# Patient Record
Sex: Female | Born: 1955 | Race: Black or African American | Hispanic: No | Marital: Married | Smoking: Never smoker
Health system: Southern US, Community
[De-identification: ages and names within clinical notes are randomized; demographics above are authoritative.]

## PROBLEM LIST (undated history)

## (undated) DIAGNOSIS — N952 Postmenopausal atrophic vaginitis: Secondary | ICD-10-CM

## (undated) DIAGNOSIS — S8992XA Unspecified injury of left lower leg, initial encounter: Secondary | ICD-10-CM

## (undated) DIAGNOSIS — E785 Hyperlipidemia, unspecified: Secondary | ICD-10-CM

---

## 2015-08-14 ENCOUNTER — Other Ambulatory Visit: Payer: Self-pay | Admitting: Family Medicine

## 2015-08-14 DIAGNOSIS — Z1231 Encounter for screening mammogram for malignant neoplasm of breast: Secondary | ICD-10-CM

## 2015-08-15 ENCOUNTER — Other Ambulatory Visit: Payer: Self-pay | Admitting: Family Medicine

## 2015-08-15 DIAGNOSIS — R319 Hematuria, unspecified: Secondary | ICD-10-CM

## 2015-08-17 ENCOUNTER — Other Ambulatory Visit: Payer: Self-pay

## 2015-08-17 ENCOUNTER — Ambulatory Visit
Admission: RE | Admit: 2015-08-17 | Discharge: 2015-08-17 | Disposition: A | Discharge status: Home / Self Care | Payer: Federal, State, Local not specified - PPO | Source: Ambulatory Visit | Attending: Family Medicine | Admitting: Family Medicine

## 2015-08-17 DIAGNOSIS — D259 Leiomyoma of uterus, unspecified: Secondary | ICD-10-CM | POA: Insufficient documentation

## 2015-08-17 DIAGNOSIS — R319 Hematuria, unspecified: Secondary | ICD-10-CM | POA: Insufficient documentation

## 2015-08-17 MED ORDER — IOHEXOL 350 MG/ML SOLN
125.0000 mL | Freq: Once | INTRAVENOUS | Status: AC | PRN
  Administered 2015-08-17: 125 mL via INTRAVENOUS

## 2015-10-09 ENCOUNTER — Ambulatory Visit
Admission: RE | Admit: 2015-10-09 | Discharge: 2015-10-09 | Disposition: A | Discharge status: Home / Self Care | Payer: Federal, State, Local not specified - PPO | Source: Ambulatory Visit | Attending: Family Medicine | Admitting: Family Medicine

## 2015-10-09 DIAGNOSIS — Z1231 Encounter for screening mammogram for malignant neoplasm of breast: Secondary | ICD-10-CM | POA: Insufficient documentation

## 2016-09-15 ENCOUNTER — Other Ambulatory Visit: Payer: Self-pay | Admitting: Family Medicine

## 2016-09-15 DIAGNOSIS — Z1231 Encounter for screening mammogram for malignant neoplasm of breast: Secondary | ICD-10-CM

## 2016-10-09 ENCOUNTER — Ambulatory Visit
Admission: RE | Admit: 2016-10-09 | Discharge: 2016-10-09 | Disposition: A | Discharge status: Home / Self Care | Payer: Federal, State, Local not specified - PPO | Source: Ambulatory Visit | Attending: Family Medicine | Admitting: Family Medicine

## 2016-10-09 DIAGNOSIS — Z1231 Encounter for screening mammogram for malignant neoplasm of breast: Secondary | ICD-10-CM

## 2017-09-28 ENCOUNTER — Other Ambulatory Visit: Payer: Self-pay | Admitting: Family Medicine

## 2017-09-28 DIAGNOSIS — Z1231 Encounter for screening mammogram for malignant neoplasm of breast: Secondary | ICD-10-CM

## 2017-10-14 ENCOUNTER — Ambulatory Visit
Admission: RE | Admit: 2017-10-14 | Discharge: 2017-10-14 | Disposition: A | Discharge status: Home / Self Care | Payer: Federal, State, Local not specified - PPO | Source: Ambulatory Visit | Attending: Family Medicine | Admitting: Family Medicine

## 2017-10-14 DIAGNOSIS — Z1231 Encounter for screening mammogram for malignant neoplasm of breast: Secondary | ICD-10-CM

## 2018-09-16 ENCOUNTER — Encounter: Payer: Self-pay | Admitting: Student

## 2018-09-17 ENCOUNTER — Ambulatory Visit
Admission: RE | Admit: 2018-09-17 | Discharge: 2018-09-17 | Disposition: A | Discharge status: Home / Self Care | Payer: Federal, State, Local not specified - PPO | Source: Ambulatory Visit | Attending: Gastroenterology | Admitting: Gastroenterology

## 2018-09-17 ENCOUNTER — Encounter
Admission: RE | Disposition: A | Discharge status: Home / Self Care | Payer: Self-pay | Source: Ambulatory Visit | Attending: Gastroenterology

## 2018-09-17 ENCOUNTER — Ambulatory Visit: Payer: Federal, State, Local not specified - PPO | Admitting: Anesthesiology

## 2018-09-17 ENCOUNTER — Encounter: Payer: Self-pay | Admitting: Emergency Medicine

## 2018-09-17 DIAGNOSIS — Z79899 Other long term (current) drug therapy: Secondary | ICD-10-CM | POA: Insufficient documentation

## 2018-09-17 DIAGNOSIS — Z86010 Personal history of colonic polyps: Secondary | ICD-10-CM | POA: Diagnosis not present

## 2018-09-17 DIAGNOSIS — K635 Polyp of colon: Secondary | ICD-10-CM | POA: Diagnosis not present

## 2018-09-17 DIAGNOSIS — E785 Hyperlipidemia, unspecified: Secondary | ICD-10-CM | POA: Diagnosis not present

## 2018-09-17 DIAGNOSIS — Z1211 Encounter for screening for malignant neoplasm of colon: Secondary | ICD-10-CM | POA: Diagnosis not present

## 2018-09-17 DIAGNOSIS — K5730 Diverticulosis of large intestine without perforation or abscess without bleeding: Secondary | ICD-10-CM | POA: Insufficient documentation

## 2018-09-17 HISTORY — DX: Postmenopausal atrophic vaginitis: N95.2

## 2018-09-17 HISTORY — PX: COLONOSCOPY WITH PROPOFOL: SHX5780

## 2018-09-17 HISTORY — DX: Hyperlipidemia, unspecified: E78.5

## 2018-09-17 HISTORY — DX: Unspecified injury of left lower leg, initial encounter: S89.92XA

## 2018-09-17 SURGERY — COLONOSCOPY WITH PROPOFOL
Anesthesia: General

## 2018-09-17 MED ORDER — SODIUM CHLORIDE 0.9 % IV SOLN
INTRAVENOUS | Status: DC
  Administered 2018-09-17: 1000 mL via INTRAVENOUS

## 2018-09-17 MED ORDER — PROPOFOL 10 MG/ML IV BOLUS
INTRAVENOUS | Status: DC | PRN
  Administered 2018-09-17: 80 mg via INTRAVENOUS

## 2018-09-17 MED ORDER — PROPOFOL 500 MG/50ML IV EMUL
INTRAVENOUS | Status: DC | PRN
  Administered 2018-09-17: 75 ug/kg/min via INTRAVENOUS

## 2018-09-17 MED ORDER — LIDOCAINE HCL (CARDIAC) PF 100 MG/5ML IV SOSY
PREFILLED_SYRINGE | INTRAVENOUS | Status: DC | PRN
  Administered 2018-09-17: 25 mg via INTRATRACHEAL

## 2018-09-17 MED ORDER — PROPOFOL 500 MG/50ML IV EMUL
INTRAVENOUS | Status: AC
  Filled 2018-09-17: qty 50

## 2018-09-17 NOTE — Anesthesia Preprocedure Evaluation (Addendum)
Anesthesia Evaluation  Patient identified by MRN, date of birth, ID band Patient awake    Reviewed: Allergy & Precautions, H&P , NPO status , Patient's Chart, lab work & pertinent test results  Airway Mallampati: II  TM Distance: >3 FB     Dental  (+) Teeth Intact   Pulmonary neg pulmonary ROS,           Cardiovascular negative cardio ROS       Neuro/Psych negative neurological ROS  negative psych ROS   GI/Hepatic negative GI ROS, Neg liver ROS,   Endo/Other  negative endocrine ROS  Renal/GU negative Renal ROS  negative genitourinary   Musculoskeletal   Abdominal   Peds  Hematology negative hematology ROS (+)   Anesthesia Other Findings Past Medical History: No date: Atrophic vaginitis No date: Hyperlipidemia No date: Left knee injury  History reviewed. No pertinent surgical history.  BMI    Body Mass Index:  28.15 kg/m      Reproductive/Obstetrics negative OB ROS                            Anesthesia Physical Anesthesia Plan  ASA: I  Anesthesia Plan: General   Post-op Pain Management:    Induction:   PONV Risk Score and Plan: Propofol infusion and TIVA  Airway Management Planned: Natural Airway and Nasal Cannula  Additional Equipment:   Intra-op Plan:   Post-operative Plan:   Informed Consent: I have reviewed the patients History and Physical, chart, labs and discussed the procedure including the risks, benefits and alternatives for the proposed anesthesia with the patient or authorized representative who has indicated his/her understanding and acceptance.   Dental Advisory Given  Plan Discussed with: Anesthesiologist, CRNA and Surgeon  Anesthesia Plan Comments:        Anesthesia Quick Evaluation

## 2018-09-17 NOTE — Transfer of Care (Signed)
Immediate Anesthesia Transfer of Care Note  Patient: Audrey Palmer  Procedure(s) Performed: COLONOSCOPY WITH PROPOFOL (N/A )  Patient Location: PACU and Endoscopy Unit  Anesthesia Type:General  Level of Consciousness: unresponsive  Airway & Oxygen Therapy: Patient Spontanous Breathing and Patient connected to nasal cannula oxygen  Post-op Assessment: Report given to RN and Post -op Vital signs reviewed and stable  Post vital signs: Reviewed and stable  Last Vitals:  Vitals Value Taken Time  BP 122/79 09/17/2018  9:19 AM  Temp 36.6 C 09/17/2018  9:19 AM  Pulse 81 09/17/2018  9:20 AM  Resp 20 09/17/2018  9:20 AM  SpO2 100 % 09/17/2018  9:20 AM  Vitals shown include unvalidated device data.  Last Pain:  Vitals:   09/17/18 0919  TempSrc: Tympanic  PainSc: Asleep         Complications: No apparent anesthesia complications

## 2018-09-17 NOTE — Anesthesia Post-op Follow-up Note (Signed)
Anesthesia QCDR form completed.        

## 2018-09-17 NOTE — Op Note (Signed)
[facility] Patient Name: Audrey Palmer Procedure Date: 09/17/2018 8:52 AM MRN: [id number] Account #: [id number] Date of Birth: 07-Mar-1956 Admit Type: Outpatient Age: 62 Room: [facility] ENDO ROOM 3 Gender: Female Note Status: Finalized Procedure:            Colonoscopy Indications:          Personal history of colonic polyps Providers:            Lollie Sails, MD Referring MD:         Rubbie Battiest. Iona Beard MD, MD (Referring MD) Medicines:            Monitored Anesthesia Care Complications:        No immediate complications. Procedure:            Pre-Anesthesia Assessment:                       - ASA Grade Assessment: I - A normal, healthy patient.                       After obtaining informed consent, the colonoscope was                        passed under direct vision. Throughout the procedure,                        the patient's blood pressure, pulse, and oxygen                        saturations were monitored continuously. The                        Colonoscope was introduced through the anus and                        advanced to the the cecum, identified by appendiceal                        orifice and ileocecal valve. The colonoscopy was                        performed without difficulty. The patient tolerated the                        procedure well. The quality of the bowel preparation                        was good. Findings:      A 2 mm polyp was found in the distal sigmoid colon. The polyp was       sessile. The polyp was removed with a cold biopsy forceps. Resection and       retrieval were complete.      A few small-mouthed diverticula were found in the sigmoid colon and       distal descending colon.      The retroflexed view of the distal rectum and anal verge was normal and       showed no anal or rectal abnormalities.      The digital rectal exam was normal. Impression:           - One 2 mm polyp in the distal sigmoid  colon,  removed                        with a cold biopsy forceps. Resected and retrieved.                       - Diverticulosis in the sigmoid colon and in the distal                        descending colon.                       - The distal rectum and anal verge are normal on                        retroflexion view. Recommendation:       - Discharge patient to home. Procedure Code(s):    --- Professional ---                       [id number], Colonoscopy, flexible; with biopsy, single or                        multiple Diagnosis Code(s):    --- Professional ---                       D12.5, Benign neoplasm of sigmoid colon                       Z86.010, Personal history of colonic polyps                       K57.30, Diverticulosis of large intestine without                        perforation or abscess without bleeding CPT copyright 2018 [employer]. All rights reserved. The codes documented in this report are preliminary and upon coder review may  be revised to meet current compliance requirements. Lollie Sails, MD 09/17/2018 9:17:23 AM This report has been signed electronically. Number of Addenda: 0 Note Initiated On: 09/17/2018 8:52 AM Scope Withdrawal Time: 0 hours 7 minutes 2 seconds  Total Procedure Duration: 0 hours 13 minutes 6 seconds       [facility]

## 2018-09-17 NOTE — H&P (Signed)
Outpatient short stay form Pre-procedure 09/17/2018 8:42 AM Audrey Sails MD  Primary Physician: Dr Salome Holmes  Reason for visit: Colonoscopy  History of present illness: Patient is a 62 year old female presenting today for colonoscopy due to her personal history of colon polyps.  She tolerated her prep well.  She takes no aspirin or blood thinning agent.    Current Facility-Administered Medications:  .  0.9 %  sodium chloride infusion, , Intravenous, Continuous, Audrey Sails, MD, Last Rate: 20 mL/hr at 09/17/18 0732, 1,000 mL at 09/17/18 0732  Medications Prior to Admission  Medication Sig Dispense Refill Last Dose  . atorvastatin (LIPITOR) 20 MG tablet Take 20 mg by mouth daily.   Past Week at Unknown time  . Cholecalciferol 25 MCG (1000 UT) capsule Take 2,000 Units by mouth daily.   Past Week at Unknown time     Not on File   Past Medical History:  Diagnosis Date  . Atrophic vaginitis   . Hyperlipidemia   . Left knee injury     Review of systems:      Physical Exam    Heart and lungs: Regular rate and rhythm without rub or gallop, lungs are bilaterally clear.    HEENT: Normocephalic atraumatic eyes are anicteric    Other:    Pertinant exam for procedure: Soft non-tender nondistended bowel sounds positive normoactive    Planned proceedures: Colonoscopy and indicated procedures. I have discussed the risks benefits and complications of procedures to include not limited to bleeding, infection, perforation and the risk of sedation and the patient wishes to proceed.    Audrey Sails, MD Gastroenterology 09/17/2018  8:42 AM

## 2018-09-20 LAB — SURGICAL PATHOLOGY

## 2018-09-20 NOTE — Anesthesia Postprocedure Evaluation (Signed)
Anesthesia Post Note  Patient: [employer]  Procedure(s) Performed: COLONOSCOPY WITH PROPOFOL (N/A )  Patient location during evaluation: PACU Anesthesia Type: General Level of consciousness: awake and alert Pain management: pain level controlled Vital Signs Assessment: post-procedure vital signs reviewed and stable Respiratory status: spontaneous breathing, nonlabored ventilation and respiratory function stable Cardiovascular status: blood pressure returned to baseline and stable Postop Assessment: no apparent nausea or vomiting Anesthetic complications: no     Last Vitals:  Vitals:   09/17/18 0919 09/17/18 0929  BP: 122/79 (!) 128/96  Pulse:    Resp: (!) 22   Temp: 36.6 C   SpO2:      Last Pain:  Vitals:   09/17/18 0939  TempSrc:   PainSc: 0-No pain                 Durenda Hurt

## 2018-09-21 ENCOUNTER — Other Ambulatory Visit: Payer: Self-pay | Admitting: Family Medicine

## 2018-09-21 ENCOUNTER — Encounter: Payer: Self-pay | Admitting: Gastroenterology

## 2018-09-21 DIAGNOSIS — Z1231 Encounter for screening mammogram for malignant neoplasm of breast: Secondary | ICD-10-CM

## 2018-10-18 ENCOUNTER — Encounter (INDEPENDENT_AMBULATORY_CARE_PROVIDER_SITE_OTHER): Payer: Self-pay

## 2018-10-18 ENCOUNTER — Ambulatory Visit
Admission: RE | Admit: 2018-10-18 | Discharge: 2018-10-18 | Disposition: A | Discharge status: Home / Self Care | Payer: Federal, State, Local not specified - PPO | Source: Ambulatory Visit | Attending: Family Medicine | Admitting: Family Medicine

## 2018-10-18 DIAGNOSIS — Z1231 Encounter for screening mammogram for malignant neoplasm of breast: Secondary | ICD-10-CM | POA: Insufficient documentation

## 2019-09-16 ENCOUNTER — Other Ambulatory Visit: Payer: Self-pay | Admitting: Family Medicine

## 2019-09-16 DIAGNOSIS — Z1231 Encounter for screening mammogram for malignant neoplasm of breast: Secondary | ICD-10-CM

## 2019-10-20 ENCOUNTER — Ambulatory Visit
Admission: RE | Admit: 2019-10-20 | Discharge: 2019-10-20 | Disposition: A | Discharge status: Home / Self Care | Payer: Federal, State, Local not specified - PPO | Source: Ambulatory Visit | Attending: Family Medicine | Admitting: Family Medicine

## 2019-10-20 ENCOUNTER — Other Ambulatory Visit: Payer: Self-pay

## 2019-10-20 DIAGNOSIS — Z1231 Encounter for screening mammogram for malignant neoplasm of breast: Secondary | ICD-10-CM | POA: Diagnosis not present

## 2019-10-25 ENCOUNTER — Other Ambulatory Visit: Payer: Self-pay | Admitting: Family Medicine

## 2019-10-25 DIAGNOSIS — R928 Other abnormal and inconclusive findings on diagnostic imaging of breast: Secondary | ICD-10-CM

## 2019-10-31 ENCOUNTER — Ambulatory Visit
Admission: RE | Admit: 2019-10-31 | Discharge: 2019-10-31 | Disposition: A | Discharge status: Home / Self Care | Payer: Federal, State, Local not specified - PPO | Source: Ambulatory Visit | Attending: Family Medicine | Admitting: Family Medicine

## 2019-10-31 DIAGNOSIS — R928 Other abnormal and inconclusive findings on diagnostic imaging of breast: Secondary | ICD-10-CM

## 2020-06-05 ENCOUNTER — Other Ambulatory Visit: Payer: Self-pay | Admitting: Family Medicine

## 2020-06-05 DIAGNOSIS — Z1231 Encounter for screening mammogram for malignant neoplasm of breast: Secondary | ICD-10-CM

## 2021-02-19 ENCOUNTER — Other Ambulatory Visit: Payer: Self-pay | Admitting: Family Medicine

## 2021-02-19 DIAGNOSIS — Z1231 Encounter for screening mammogram for malignant neoplasm of breast: Secondary | ICD-10-CM

## 2021-03-04 ENCOUNTER — Ambulatory Visit
Admission: RE | Admit: 2021-03-04 | Discharge: 2021-03-04 | Disposition: A | Discharge status: Home / Self Care | Payer: Federal, State, Local not specified - PPO | Source: Ambulatory Visit | Attending: Family Medicine | Admitting: Family Medicine

## 2021-03-04 ENCOUNTER — Other Ambulatory Visit: Payer: Self-pay

## 2021-03-04 DIAGNOSIS — Z1231 Encounter for screening mammogram for malignant neoplasm of breast: Secondary | ICD-10-CM | POA: Insufficient documentation

## 2021-03-21 IMAGING — MG DIGITAL SCREENING BILAT W/ TOMO W/ CAD
8 series · 8 of 24 positions shown · non-contrast
Comparison: Previous exam(s).

CLINICAL DATA: Screening.

EXAM:
DIGITAL SCREENING BILATERAL MAMMOGRAM WITH TOMO AND CAD

[L MLO synth-2D]
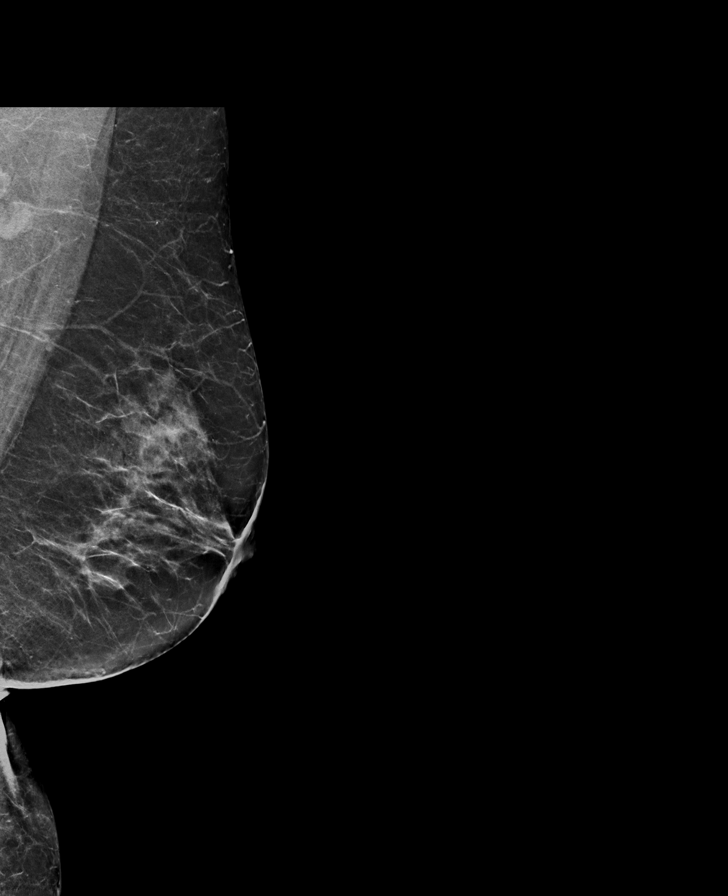

[R MLO synth-2D]
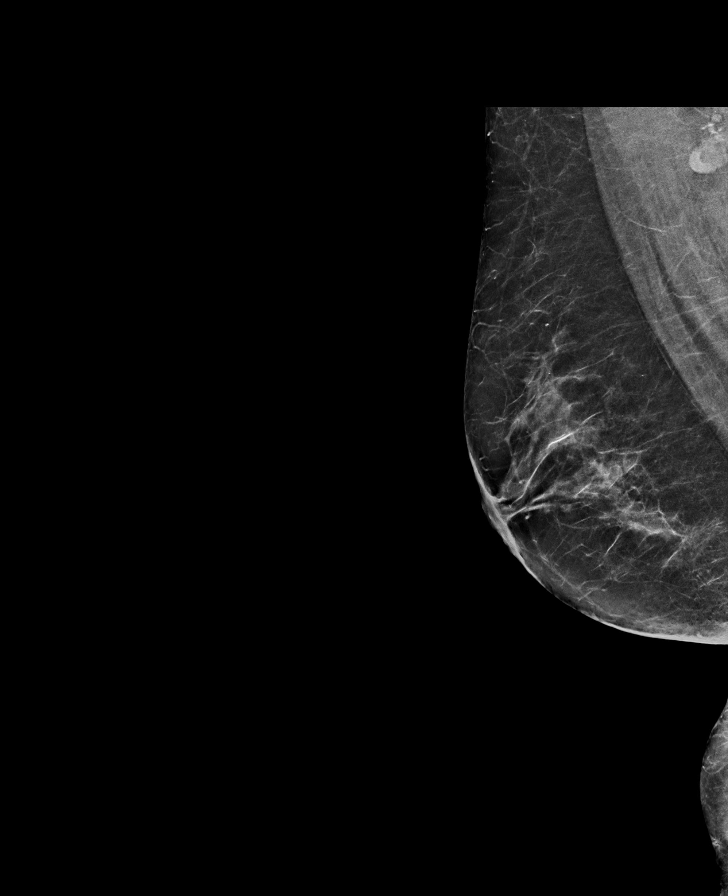

[L CC synth-2D]
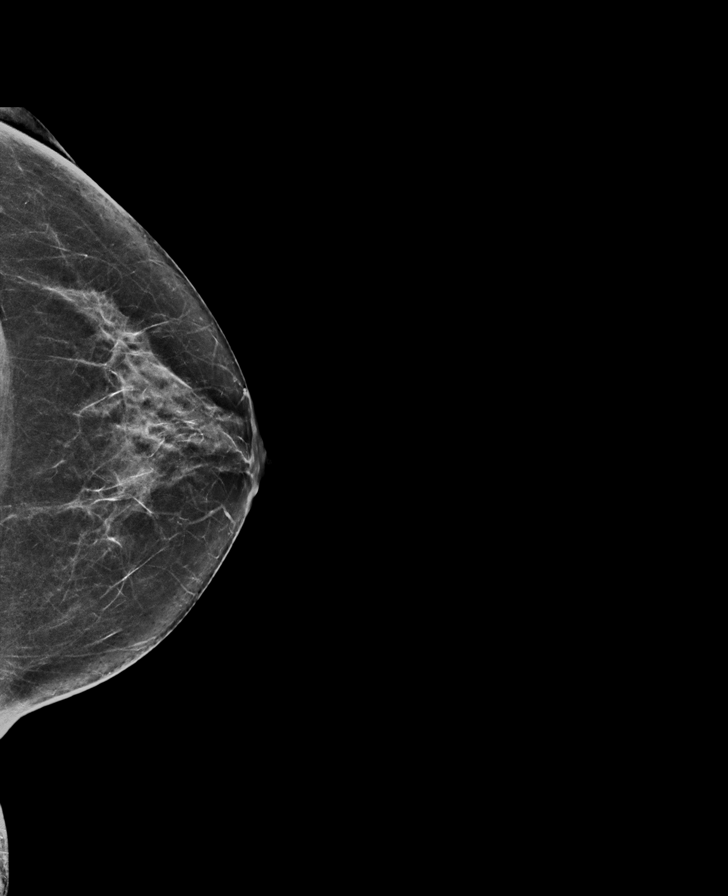

[R CC synth-2D]
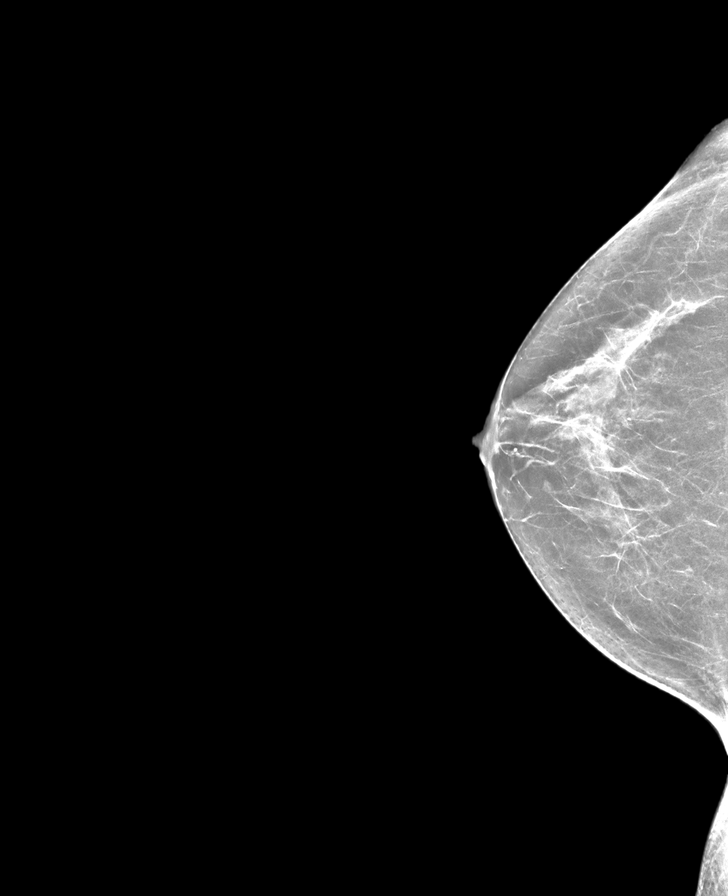

[R CC tomo · tomo slice 31/60.0]
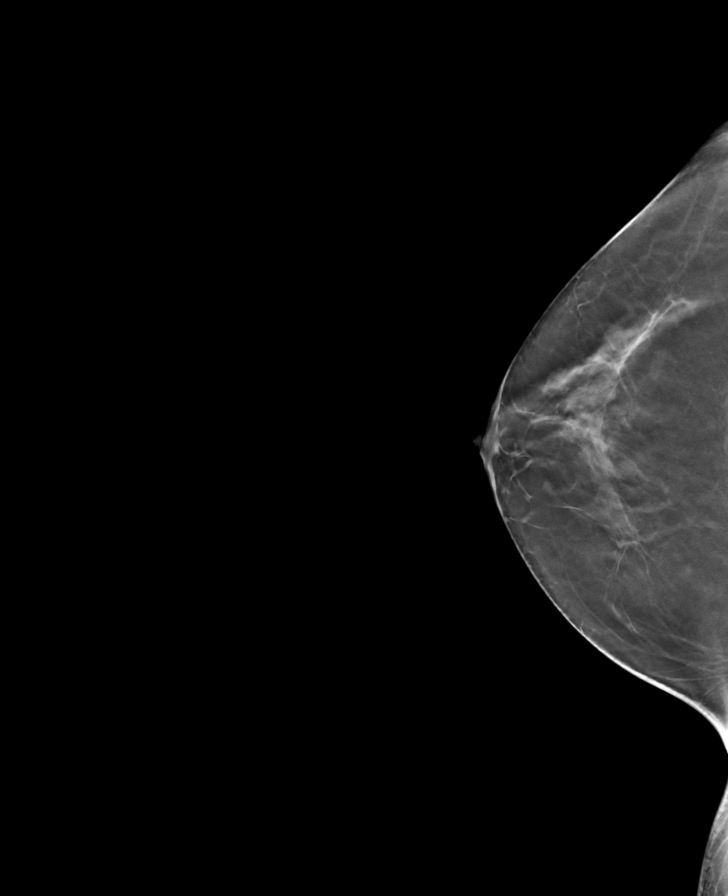

[L CC tomo · tomo slice 31/62.0]
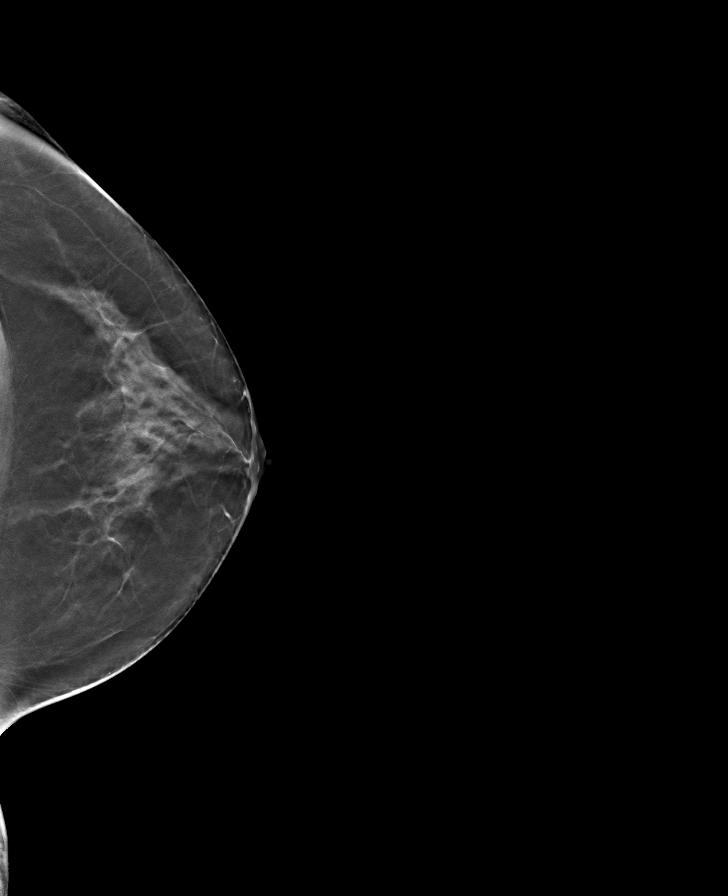

[L MLO tomo · tomo slice 32/63.0]
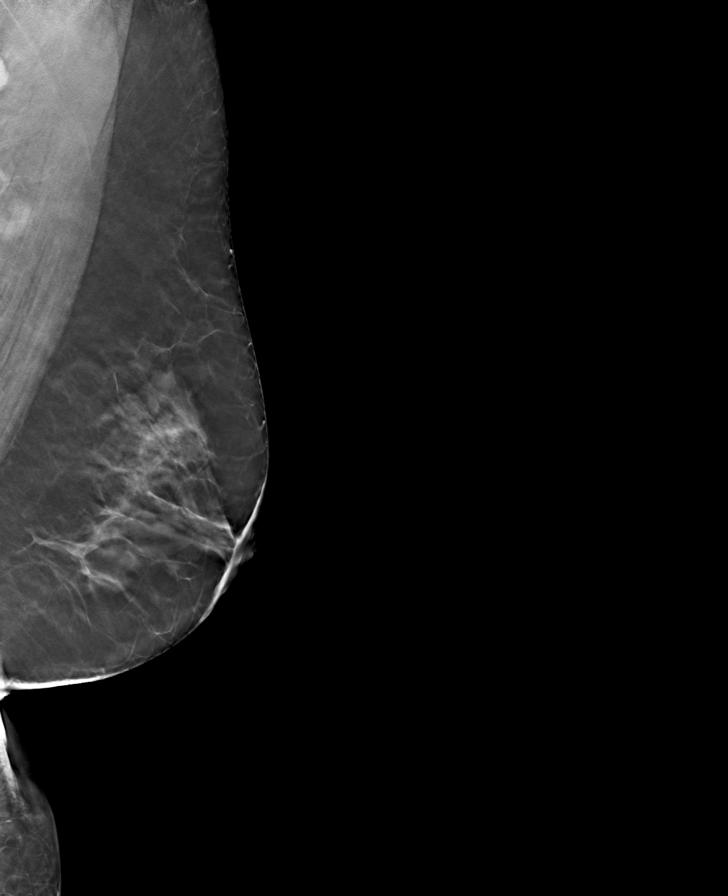

[R MLO tomo · tomo slice 31/61.0]
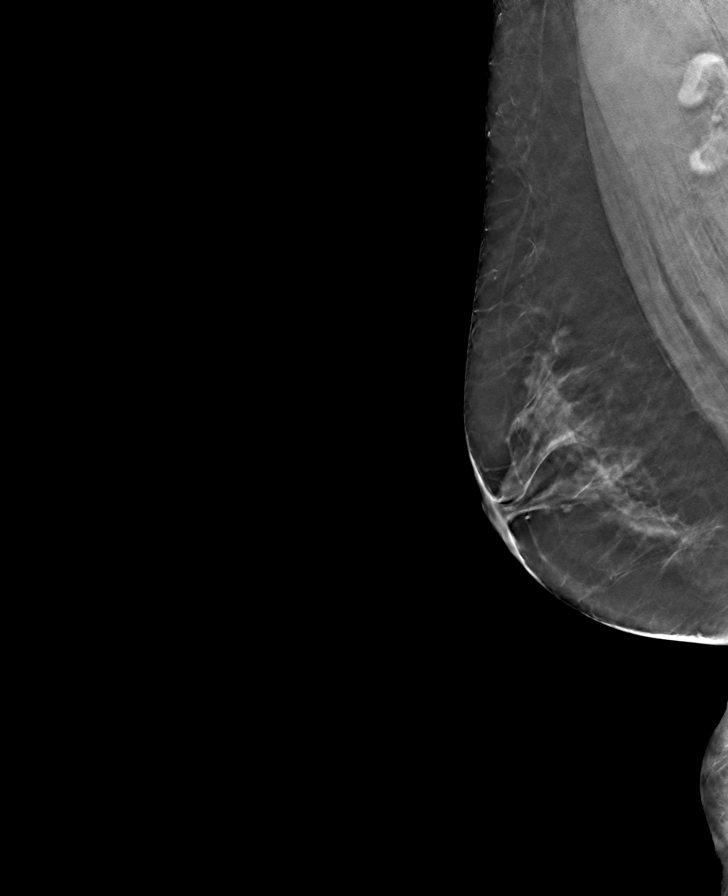

[8 of 24 positions shown; findings below may reference images not displayed]

ACR Breast Density Category b: There are scattered areas of
fibroglandular density.
FINDINGS: In the right breast, possible distortion warrants further
evaluation. This possible distortion is seen within the lower RIGHT
breast, at posterior depth, MLO view only.

In the left breast, no findings suspicious for malignancy. Images
were processed with CAD.
IMPRESSION: Further evaluation is suggested for possible distortion in the right
breast.

RECOMMENDATION:
Diagnostic mammogram and possibly ultrasound of the right breast.
(Code:E4-Y-BB3)

The patient will be contacted regarding the findings, and additional
imaging will be scheduled.

BI-RADS CATEGORY  0: Incomplete. Need additional imaging evaluation
and/or prior mammograms for comparison.

## 2021-06-06 ENCOUNTER — Other Ambulatory Visit: Payer: Self-pay | Admitting: Family Medicine

## 2021-06-06 DIAGNOSIS — Z780 Asymptomatic menopausal state: Secondary | ICD-10-CM

## 2022-01-27 ENCOUNTER — Other Ambulatory Visit: Payer: Self-pay | Admitting: Family Medicine

## 2022-01-27 DIAGNOSIS — Z1231 Encounter for screening mammogram for malignant neoplasm of breast: Secondary | ICD-10-CM

## 2022-03-10 ENCOUNTER — Ambulatory Visit
Admission: RE | Admit: 2022-03-10 | Discharge: 2022-03-10 | Disposition: A | Discharge status: Home / Self Care | Payer: Medicare Other | Source: Ambulatory Visit | Attending: Family Medicine | Admitting: Family Medicine

## 2022-03-10 DIAGNOSIS — Z1231 Encounter for screening mammogram for malignant neoplasm of breast: Secondary | ICD-10-CM | POA: Insufficient documentation

## 2023-02-23 ENCOUNTER — Other Ambulatory Visit: Payer: Self-pay | Admitting: Family Medicine

## 2023-02-23 DIAGNOSIS — Z1231 Encounter for screening mammogram for malignant neoplasm of breast: Secondary | ICD-10-CM

## 2023-03-19 ENCOUNTER — Ambulatory Visit
Admission: RE | Admit: 2023-03-19 | Discharge: 2023-03-19 | Disposition: A | Discharge status: Home / Self Care | Payer: Medicare Other | Source: Ambulatory Visit | Attending: Family Medicine | Admitting: Family Medicine

## 2023-03-19 DIAGNOSIS — Z1231 Encounter for screening mammogram for malignant neoplasm of breast: Secondary | ICD-10-CM | POA: Insufficient documentation

## 2024-01-20 ENCOUNTER — Ambulatory Visit

## 2024-01-20 DIAGNOSIS — K635 Polyp of colon: Secondary | ICD-10-CM | POA: Diagnosis not present

## 2024-01-20 DIAGNOSIS — K5730 Diverticulosis of large intestine without perforation or abscess without bleeding: Secondary | ICD-10-CM | POA: Diagnosis not present

## 2024-01-20 DIAGNOSIS — Z1211 Encounter for screening for malignant neoplasm of colon: Secondary | ICD-10-CM | POA: Diagnosis present

## 2024-02-03 ENCOUNTER — Other Ambulatory Visit: Payer: Self-pay | Admitting: Family Medicine

## 2024-02-03 DIAGNOSIS — Z1231 Encounter for screening mammogram for malignant neoplasm of breast: Secondary | ICD-10-CM

## 2024-03-22 ENCOUNTER — Ambulatory Visit
Admission: RE | Admit: 2024-03-22 | Discharge: 2024-03-22 | Disposition: A | Discharge status: Home / Self Care | Source: Ambulatory Visit | Attending: Family Medicine | Admitting: Family Medicine

## 2024-03-22 DIAGNOSIS — Z1231 Encounter for screening mammogram for malignant neoplasm of breast: Secondary | ICD-10-CM | POA: Insufficient documentation
